# Patient Record
Sex: Male | Born: 1995 | Race: White | Hispanic: No | Marital: Single | Smoking: Former smoker
Health system: Southern US, Community
[De-identification: ages and names within clinical notes are randomized; demographics above are authoritative.]

## PROBLEM LIST (undated history)

## (undated) HISTORY — PX: WISDOM TOOTH EXTRACTION: SHX21

---
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
## 2014-01-26 ENCOUNTER — Emergency Department (HOSPITAL_COMMUNITY)
Admission: EM | Admit: 2014-01-26 | Discharge: 2014-01-26 | Disposition: A | Discharge status: Home / Self Care | Payer: BC Managed Care – PPO | Attending: Emergency Medicine | Admitting: Emergency Medicine

## 2014-01-26 ENCOUNTER — Emergency Department (HOSPITAL_COMMUNITY): Payer: BC Managed Care – PPO

## 2014-01-26 ENCOUNTER — Encounter (HOSPITAL_COMMUNITY): Payer: Self-pay | Admitting: Emergency Medicine

## 2014-01-26 DIAGNOSIS — Y9389 Activity, other specified: Secondary | ICD-10-CM | POA: Diagnosis not present

## 2014-01-26 DIAGNOSIS — S42001A Fracture of unspecified part of right clavicle, initial encounter for closed fracture: Secondary | ICD-10-CM

## 2014-01-26 DIAGNOSIS — S46909A Unspecified injury of unspecified muscle, fascia and tendon at shoulder and upper arm level, unspecified arm, initial encounter: Secondary | ICD-10-CM | POA: Insufficient documentation

## 2014-01-26 DIAGNOSIS — S42023A Displaced fracture of shaft of unspecified clavicle, initial encounter for closed fracture: Secondary | ICD-10-CM | POA: Insufficient documentation

## 2014-01-26 DIAGNOSIS — Y9289 Other specified places as the place of occurrence of the external cause: Secondary | ICD-10-CM | POA: Insufficient documentation

## 2014-01-26 DIAGNOSIS — S4980XA Other specified injuries of shoulder and upper arm, unspecified arm, initial encounter: Secondary | ICD-10-CM | POA: Insufficient documentation

## 2014-01-26 DIAGNOSIS — W010XXA Fall on same level from slipping, tripping and stumbling without subsequent striking against object, initial encounter: Secondary | ICD-10-CM | POA: Insufficient documentation

## 2014-01-26 DIAGNOSIS — Z87891 Personal history of nicotine dependence: Secondary | ICD-10-CM | POA: Insufficient documentation

## 2014-01-26 MED ORDER — OXYCODONE-ACETAMINOPHEN 5-325 MG PO TABS: 1.0000 | ORAL_TABLET | Freq: Four times a day (QID) | ORAL | Status: DC | PRN

## 2014-01-26 MED ORDER — OXYCODONE-ACETAMINOPHEN 5-325 MG PO TABS
1.0000 | ORAL_TABLET | Freq: Once | ORAL | Status: AC
  Administered 2014-01-26: 1 via ORAL
  Filled 2014-01-26: qty 1

## 2014-01-26 NOTE — Discharge Instructions (Signed)
Clavicle Fracture °The clavicle, also called the collarbone, is the long bone that connects your shoulder to your rib cage. You can feel your collarbone at the top of your shoulders and rib cage. A clavicle fracture is a broken clavicle. It is a common injury that can happen at any age.  °CAUSES °Common causes of a clavicle fracture include: °· A direct blow to your shoulder. °· A car accident. °· A fall, especially if you try to break your fall with an outstretched arm. °RISK FACTORS °You may be at increased risk if: °· You are younger than 25 years or older than 75 years. Most clavicle fractures happen to people who are younger than 25 years. °· You are a male. °· You play contact sports. °SIGNS AND SYMPTOMS °A fractured clavicle is painful. It also makes it hard to move your arm. Other signs and symptoms may include: °· A shoulder that drops downward and forward. °· Pain when trying to lift your shoulder. °· Bruising, swelling, and tenderness over your clavicle. °· A grinding noise when you try to move your shoulder. °· A bump over your clavicle. °DIAGNOSIS °Your health care provider can usually diagnose a clavicle fracture by asking about your injury and examining your shoulder and clavicle. He or she may take an X-ray to determine the position of your clavicle. °TREATMENT °Treatment depends on the position of your clavicle after the fracture: °· If the broken ends of the bone are not out of place, your health care provider may put your arm in a sling or wrap a support bandage around your chest (figure-of-eight wrap). °· If the broken ends of the bone are out of place, you may need surgery. Surgery may involve placing screws, pins, or plates to keep your clavicle stable while it heals. Healing may take about 3 months. °When your health care provider thinks your fracture has healed enough, you may have to do physical therapy to regain normal movement and build up your arm strength. °HOME CARE INSTRUCTIONS   °· Apply ice to the injured area: °¨ Put ice in a plastic bag. °¨ Place a towel between your skin and the bag. °¨ Leave the ice on for 20 minutes, 2-3 times a day. °· If you have a wrap or splint: °¨ Wear it all the time, and remove it only to take a bath or shower. °¨ When you bathe or shower, keep your shoulder in the same position as when the sling or wrap is on. °¨ Do not lift your arm. °· If you have a figure-of-eight wrap: °¨ Another person must tighten it every day. °¨ It should be tight enough to hold your shoulders back. °¨ Allow enough room to place your index finger between your body and the strap. °¨ Loosen the wrap immediately if you feel numbness or tingling in your hands. °· Only take medicines as directed by your health care provider. °· Avoid activities that make the injury or pain worse for 4-6 weeks after surgery. °· Keep all follow-up appointments. °SEEK MEDICAL CARE IF:  °Your medicine is not helping to relieve pain and swelling. °SEEK IMMEDIATE MEDICAL CARE IF:  °Your arm is numb, cold, or pale, even when the splint is loose. °MAKE SURE YOU:  °· Understand these instructions. °· Will watch your condition. °· Will get help right away if you are not doing well or get worse. °Document Released: 02/02/2005 Document Revised: 04/30/2013 Document Reviewed: 03/18/2013 °[employer]® Patient Information ©2015 [employer]. This information is   not intended to replace advice given to you by your health care provider. Make sure you discuss any questions you have with your health care provider. ° °
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
## 2014-08-12 ENCOUNTER — Emergency Department (INDEPENDENT_AMBULATORY_CARE_PROVIDER_SITE_OTHER)
Admission: EM | Admit: 2014-08-12 | Discharge: 2014-08-12 | Disposition: A | Discharge status: Home / Self Care | Payer: BLUE CROSS/BLUE SHIELD | Source: Home / Self Care | Attending: Family Medicine | Admitting: Family Medicine

## 2014-08-12 ENCOUNTER — Emergency Department (INDEPENDENT_AMBULATORY_CARE_PROVIDER_SITE_OTHER): Payer: BLUE CROSS/BLUE SHIELD

## 2014-08-12 ENCOUNTER — Encounter (HOSPITAL_COMMUNITY): Payer: Self-pay | Admitting: Emergency Medicine

## 2014-08-12 DIAGNOSIS — S60221A Contusion of right hand, initial encounter: Secondary | ICD-10-CM

## 2014-08-12 NOTE — Discharge Instructions (Signed)
Wear splint for comfort, soak hand twice a day for stiffness and swelling, advil for soreness.
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
## 2014-08-12 NOTE — ED Provider Notes (Signed)
CSN: [id number]     Arrival date & time 08/12/14  0800 History   First MD Initiated Contact with Patient 08/12/14 0809     Chief Complaint  Patient presents with  . Hand Injury   (Consider location/radiation/quality/duration/timing/severity/associated sxs/prior Treatment) Patient is a 19 y.o. male presenting with hand injury. The history is provided by the patient.  Hand Injury Location:  Hand Time since incident:  1 day Hand location:  Dorsum of R hand Pain details:    Quality:  Throbbing   Radiates to:  Does not radiate   Severity:  Mild Chronicity:  New Dislocation: no   Foreign body present:  No foreign bodies Prior injury to area:  No Relieved by:  Ice Ineffective treatments:  None tried Associated symptoms: decreased range of motion, stiffness and swelling   Associated symptoms: no numbness     History reviewed. No pertinent past medical history. History reviewed. No pertinent past surgical history. History reviewed. No pertinent family history. History  Substance Use Topics  . Smoking status: Former Games developermoker  . Smokeless tobacco: Not on file  . Alcohol Use: Yes    Review of Systems  Constitutional: Negative.   Musculoskeletal: Positive for joint swelling and stiffness.  Skin: Negative.     Allergies  Review of patient's allergies indicates no known allergies.  Home Medications   Prior to Admission medications   Medication Sig Start Date End Date Taking? Authorizing Provider  amoxicillin (AMOXIL) 500 MG tablet Take 500 mg by mouth 2 (two) times daily.   Yes Historical Provider, MD  ibuprofen (ADVIL,MOTRIN) 200 MG tablet Take 400 mg by mouth every 6 (six) hours as needed (for pain).    Historical Provider, MD  oxyCODONE-acetaminophen (PERCOCET/ROXICET) 5-325 MG per tablet Take 1-2 tablets by mouth every 6 (six) hours as needed for severe pain. 01/26/14   Benjiman CoreNathan Pickering, MD   BP 124/66 mmHg  Pulse 66  Temp(Src) 98.1 F (36.7 C) (Oral)  Resp 16  SpO2  100% Physical Exam  Constitutional: He is oriented to person, place, and time. He appears well-developed and well-nourished. No distress.  Musculoskeletal: He exhibits tenderness.       Hands: Neurological: He is alert and oriented to person, place, and time.  Skin: Skin is warm and dry.  Nursing note and vitals reviewed.   ED Course  Procedures (including critical care time) Labs Review Labs Reviewed - No data to display  Imaging Review Dg Hand Complete Right  08/12/2014   CLINICAL DATA:  Right hand swelling and bruising, soccer injury last night  EXAM: RIGHT HAND - COMPLETE 3+ VIEW  COMPARISON:  None.  FINDINGS: Three views of the right hand submitted. No acute fracture or subluxation. Soft tissue swelling noted dorsal metacarpal region.  IMPRESSION: No acute fracture or subluxation. Soft tissue swelling metacarpal region.   Electronically Signed   By: Natasha MeadLiviu  Pop M.D.   On: 08/12/2014 08:33     MDM   1. Contusion, hand, right, initial encounter        Linna HoffJames D Tayvon Culley, MD 08/12/14 [phone]

## 2015-03-05 ENCOUNTER — Encounter (HOSPITAL_COMMUNITY): Payer: Self-pay | Admitting: Emergency Medicine

## 2015-03-05 ENCOUNTER — Emergency Department (INDEPENDENT_AMBULATORY_CARE_PROVIDER_SITE_OTHER)
Admission: EM | Admit: 2015-03-05 | Discharge: 2015-03-05 | Disposition: A | Discharge status: Home / Self Care | Payer: BLUE CROSS/BLUE SHIELD | Source: Home / Self Care | Attending: Emergency Medicine | Admitting: Emergency Medicine

## 2015-03-05 ENCOUNTER — Emergency Department (INDEPENDENT_AMBULATORY_CARE_PROVIDER_SITE_OTHER): Payer: BLUE CROSS/BLUE SHIELD

## 2015-03-05 DIAGNOSIS — S62625A Displaced fracture of medial phalanx of left ring finger, initial encounter for closed fracture: Secondary | ICD-10-CM

## 2015-03-05 NOTE — Discharge Instructions (Signed)
Finger is broken. Keep the splint on until you see Dr. Izora Ribasoley. Apply ice to help with the swelling. Call Dr. Izora Ribasoley tomorrow morning for an appointment next week.

## 2015-03-05 NOTE — ED Notes (Signed)
C/o left ring finger injury due to playing flag football two weeks ago Not able to bend finger  States did ice finger as tx

## 2015-03-05 NOTE — ED Provider Notes (Signed)
CSN: [id number]     Arrival date & time 03/05/15  1810 History   First MD Initiated Contact with Patient 03/05/15 1839     Chief Complaint  Patient presents with  . Finger Injury   (Consider location/radiation/quality/duration/timing/severity/associated sxs/prior Treatment) HPI  He is a 19 year old man here for evaluation of left ring finger injury. He states he is playing flag football 2 weeks ago when he injured his left ring finger. He states it was deviated towards the middle finger. He states he freaked out and grabbed his finger. It popped back into place and he developed some swelling at the PIP joint. He has been applying ice, but the swelling has not gone down. He states he is unable to fully flex the finger. No numbness or tingling.  History reviewed. No pertinent past medical history. History reviewed. No pertinent past surgical history. History reviewed. No pertinent family history. Social History  Substance Use Topics  . Smoking status: Former Games developermoker  . Smokeless tobacco: None  . Alcohol Use: Yes    Review of Systems As in history of present illness Allergies  Review of patient's allergies indicates no known allergies.  Home Medications   Prior to Admission medications   Medication Sig Start Date End Date Taking? Authorizing Provider  ibuprofen (ADVIL,MOTRIN) 200 MG tablet Take 400 mg by mouth every 6 (six) hours as needed (for pain).    Historical Provider, MD   Meds Ordered and Administered this Visit  Medications - No data to display  BP 124/82 mmHg  Pulse 56  Temp(Src) 98.9 F (37.2 C) (Oral)  Resp 16  SpO2 100% No data found.   Physical Exam  Constitutional: He is oriented to person, place, and time. He appears well-developed and well-nourished. No distress.  Cardiovascular: Normal rate.   Pulmonary/Chest: Effort normal.  Musculoskeletal:  Left ring finger: There is swelling at the PIP joint. No point tenderness. Brisk cap refill. He is able to flex  both the PIP and DIP joint. He is unable to completely make a fist with that finger. He is able to fully extend the finger.  Neurological: He is alert and oriented to person, place, and time.    ED Course  Procedures (including critical care time)  Labs Review Labs Reviewed - No data to display  Imaging Review Dg Finger Ring Left  03/05/2015  CLINICAL DATA:  Mild pain and limited range of motion after dislocating the left ring finger playing flag football 2 weeks ago. EXAM: LEFT RING FINGER 2+V COMPARISON:  None. FINDINGS: Soft tissue swelling at the level of the fourth PIP joint. Mildly comminuted fracture of the base of the fourth proximal phalanx with a medially displaced bone fragment. There is also a ventrally displaced fragment. IMPRESSION: Mildly comminuted fracture of the base of the fourth proximal phalanx with associated soft tissue swelling. Electronically Signed   By: Beckie SaltsSteven  Reid M.D.   On: 03/05/2015 19:12      MDM   1. Closed displaced fracture of medial phalanx of left ring finger, initial encounter    Placed in splint. Apply ice to help with swelling. Follow-up with Dr. Izora Ribasoley next week.    Charm RingsErin J Honig, MD 03/05/15 [phone]

## 2015-07-17 ENCOUNTER — Emergency Department (INDEPENDENT_AMBULATORY_CARE_PROVIDER_SITE_OTHER): Payer: 59

## 2015-07-17 ENCOUNTER — Emergency Department (INDEPENDENT_AMBULATORY_CARE_PROVIDER_SITE_OTHER)
Admission: EM | Admit: 2015-07-17 | Discharge: 2015-07-17 | Disposition: A | Discharge status: Home / Self Care | Payer: 59 | Source: Home / Self Care | Attending: Family Medicine | Admitting: Family Medicine

## 2015-07-17 DIAGNOSIS — R0789 Other chest pain: Secondary | ICD-10-CM

## 2015-07-17 DIAGNOSIS — R079 Chest pain, unspecified: Secondary | ICD-10-CM | POA: Diagnosis not present

## 2015-07-17 DIAGNOSIS — R9431 Abnormal electrocardiogram [ECG] [EKG]: Secondary | ICD-10-CM

## 2015-07-17 DIAGNOSIS — R002 Palpitations: Secondary | ICD-10-CM

## 2015-07-17 DIAGNOSIS — R55 Syncope and collapse: Secondary | ICD-10-CM | POA: Diagnosis not present

## 2015-07-17 LAB — POCT CBC W AUTO DIFF (K'VILLE URGENT CARE)

## 2015-07-17 NOTE — ED Notes (Signed)
Orthostatic BP lying HR-79 BP 156/92 O2 99 Sitting HR 79 BP 149/100 O2 97 Standing HR 96 BP 168/113 O2 99

## 2015-07-17 NOTE — ED Notes (Signed)
Around 2 pm today had pressure in his chest, and a few seconds of SOB.  He felt dizzy after, and like his heart was skipping a beat.

## 2015-07-17 NOTE — ED Provider Notes (Signed)
CSN: [id number]     Arrival date & time 07/17/15  1932 History   First MD Initiated Contact with Patient 07/17/15 1941     Chief Complaint  Patient presents with  . Chest Pain   (Consider location/radiation/quality/duration/timing/severity/associated sxs/prior Treatment) HPI  The pt is a 20yo male [facility] by his father with c/o intermittent episodes of heart palpitations and chest pressure that lasts about 1-2 minutes at a time.  They care start when pt is at rest and resolve on their own.  Symptoms started 2-3 weeks ago but today one episode around 2PM was the worst he has had since symptoms started. He was speaking with a customer when he suddenly became SOB and had to walk away to sit down as he was becoming dizzy.  Symptoms resolved within 1 minute. Denies LOC. Denies nausea, vomiting or diaphoresis.  Chest pain is described as a mild pressure in the center of his chest. Denies having more stress in his life than normal. Denies any significant life changes including no decreased sleep or excess caffeine intake. Denies family hx of heart problems.  He has not had an annual physical in several years as he is otherwise healthy. No daily medications. No recent travel. Denies leg pain or swelling.    History reviewed. No pertinent past medical history. Past Surgical History  Procedure Laterality Date  . Wisdom tooth extraction     History reviewed. No pertinent family history. Social History  Substance Use Topics  . Smoking status: Former Games developermoker  . Smokeless tobacco: None  . Alcohol Use: Yes    Review of Systems  Constitutional: Negative for fever, chills, diaphoresis and fatigue.  HENT: Negative for congestion, sore throat, trouble swallowing and voice change.   Respiratory: Positive for shortness of breath. Negative for cough.   Cardiovascular: Positive for chest pain and palpitations. Negative for leg swelling.  Gastrointestinal: Negative for nausea, vomiting, abdominal pain and  diarrhea.  Musculoskeletal: Negative for myalgias, back pain and arthralgias.  Neurological: Positive for dizziness and light-headedness. Negative for syncope, weakness and headaches.    Allergies  Review of patient's allergies indicates no known allergies.  Home Medications   Prior to Admission medications   Medication Sig Start Date End Date Taking? Authorizing Provider  ibuprofen (ADVIL,MOTRIN) 200 MG tablet Take 400 mg by mouth every 6 (six) hours as needed (for pain).    Historical Provider, MD   Meds Ordered and Administered this Visit  Medications - No data to display  BP 154/96 mmHg  Pulse 86  Temp(Src) 98.2 F (36.8 C) (Oral)  Ht 5\' 11"  (1.803 m)  SpO2 98% No data found.   Physical Exam  Constitutional: He appears well-developed and well-nourished.  Thin male lying on exam bed, NAD.   HENT:  Head: Normocephalic and atraumatic.  Eyes: Conjunctivae are normal. No scleral icterus.  Neck: Normal range of motion. Neck supple.  Cardiovascular: Normal rate, regular rhythm and normal heart sounds.   Regular rate and rhythm  Pulmonary/Chest: Effort normal and breath sounds normal. No respiratory distress. He has no wheezes. He has no rales. He exhibits no tenderness.  Abdominal: Soft. He exhibits no distension. There is no tenderness.  Musculoskeletal: Normal range of motion.  Neurological: He is alert.  Skin: Skin is warm and dry.  Psychiatric: He has a normal mood and affect. His behavior is normal.  Nursing note and vitals reviewed.   ED Course  Procedures (including critical care time)  Labs Review Labs Reviewed  BASIC METABOLIC PANEL  POCT CBC W AUTO DIFF ([facility])    Imaging Review Dg Chest 2 View  07/17/2015  CLINICAL DATA:  Left-sided chest pain and palpitations. Tingling going down left arm. Symptoms for 1 month, progressive today. EXAM: CHEST  2 VIEW COMPARISON:  None. FINDINGS: The cardiomediastinal contours are normal. The lungs are  clear. Pulmonary vasculature is normal. No consolidation, pleural effusion, or pneumothorax. No acute osseous abnormalities are seen. IMPRESSION: Normal chest radiograph. Electronically Signed   By: Rubye Oaks M.D.   On: 07/17/2015 20:07    Date/Time:07/17/15   19:49:38  Ventricular Rate: 89 PR Interval: 116 QRS Duration: 98 QT Interval: 372 QTC Calculation: 422 Text Interpretation: Sinus rhythm, short PR syndrome. RSR(V1)- probably normal for age. Voltage criteria for LVH (R(V5) exceeds 2.60 mV) -Voltage criteria w/o ST/T abnormality may be normal.   No prior EKG to compare.     MDM   1. Chest pressure   2. Palpitations   3. Shortened PR interval   4. Near syncope     Pt c/o chest pressure with palpitations and mild dizziness but no syncope. No prior hx of heart problems or family hx of heart problems.    EKG- significant for short PR interval and possible LVH.  Encouraged close f/u with PCP next week.  Cardiology referral placed. Encouraged to call if he does not get a call from cardiology by next week.  Advised cardiology appointment however may not be fore another few weeks.    Advised not to drive until f/u with PCP due to risk of passing out while driving. Encouraged to limit caffeine consumption and no strenuous physical activity until f/u with PCP or cardiology.  Discussed symptoms that warrant emergent care in the ED. Patient and father verbalized understanding and agreement with treatment plan.   Junius Finner, PA-C 07/17/15 2034

## 2015-07-17 NOTE — Discharge Instructions (Signed)
Please try not to drive until reevaluated.  Limit caffeine intake.  Be sure to follow up with primary care and cardiology for further evaluation of symptoms.    Please call 911 or go to closest emergency department if symptoms worsen over the weekend.

## 2015-07-18 LAB — BASIC METABOLIC PANEL
BUN: 11 mg/dL (ref 7–25)
CO2: 28 mmol/L (ref 20–31)
Calcium: 10 mg/dL (ref 8.6–10.3)
Chloride: 99 mmol/L (ref 98–110)
Creat: 0.88 mg/dL (ref 0.60–1.35)
Glucose, Bld: 94 mg/dL (ref 65–99)
Potassium: 3.9 mmol/L (ref 3.5–5.3)
Sodium: 137 mmol/L (ref 135–146)

## 2015-07-19 ENCOUNTER — Telehealth: Payer: Self-pay | Admitting: Emergency Medicine

## 2015-08-26 ENCOUNTER — Ambulatory Visit (INDEPENDENT_AMBULATORY_CARE_PROVIDER_SITE_OTHER): Payer: 59 | Admitting: Cardiovascular Disease

## 2015-08-26 ENCOUNTER — Encounter: Payer: Self-pay | Admitting: Cardiovascular Disease

## 2015-08-26 VITALS — BP 126/90 | HR 69 | Ht 71.0 in | Wt 150.8 lb

## 2015-08-26 DIAGNOSIS — R002 Palpitations: Secondary | ICD-10-CM

## 2015-08-26 DIAGNOSIS — I471 Supraventricular tachycardia, unspecified: Secondary | ICD-10-CM | POA: Insufficient documentation

## 2015-08-26 NOTE — Patient Instructions (Addendum)
Medication Instructions:  Your physician does not recommend that you start any new medications   Labwork: None Ordered   Testing/Procedures: Your physician has requested that you have an echocardiogram. Echocardiography is a painless test that uses sound waves to create images of your heart. It provides your doctor with information about the size and shape of your heart and how well your heart's chambers and valves are working. This procedure takes approximately one hour. There are no restrictions for this procedure.   Your physician has recommended that you wear an event monitor. Event monitors are medical devices that record the heart's electrical activity. Doctors most often us these monitors to diagnose arrhythmias. Arrhythmias are problems with the speed or rhythm of the heartbeat. The monitor is a small, portable device. You can wear one while you do your normal daily activities. This is usually used to diagnose what is causing palpitations/syncope (passing out).   Follow-Up: Your physician recommends that you schedule a follow-up appointment in: 6 weeks with Dr. Elease HashimotoNahser   If you need a refill on your cardiac medications before your next appointment, please call your pharmacy.   Thank you for choosing [facility]! Eligha BridegroomMichelle Alethia Melendrez, RN [phone]

## 2015-08-26 NOTE — Progress Notes (Signed)
Cardiology Office Note   Date:  08/26/2015   ID:  Frederick Austin, DOB Jan 25, 1996, MRN [id number]  PCP:  PROVIDER NOT IN SYSTEM  Cardiologist:   Trevia Nop, Deloris PingPhilip J, MD   Chief Complaint  Patient presents with  . Tachycardia   Problem List 1.  tachypalpitations   History of Present Illness: Frederick Austin is a 20 y.o. male who presents for tachycardia  For the past 3-4 weeks,  Was talking to a customer ( works at [employer]) , felt very light headed, felt his heart racing . Initial episode last 5 minutes. Splashed cold water on his face, took several deep breaths,  Felt better but still not quite right. Sent to urgent care in [place],  Has occurred several times since that time.    Does not last as long   He has noticed that these occur predominately on days following a night of drinking .     The long episode was associated with chest pressure Has had some problems sleeping  - feels that his heart is pounding . Has sudden palpitations that cause him to "need to catch his breath"  These last for only a second - likely are PVC  Has cut out caffeine  - used to drink several coffees.    Has stopped drinking sodas Occasional addarall ( a friends )   Has started running in the mornings -  Since that time, had no problems  Played sports in high school.  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    No past medical history on file.  Past Surgical History  Procedure Laterality Date  . Wisdom tooth extraction       Current Outpatient Prescriptions  Medication Sig Dispense Refill  . ibuprofen (ADVIL,MOTRIN) 200 MG tablet Take 400 mg by mouth every 6 (six) hours as needed (for pain).     No current facility-administered medications for this visit.    Allergies:   Review of patient's allergies indicates no known allergies.    Social History:  The patient  reports that he has quit smoking. He does not have any smokeless  tobacco history on file. He reports that he drinks alcohol. He reports that he does not use illicit drugs.   Family History:  The patient's family history includes Heart murmur in his mother.    ROS:  Please see the history of present illness.    Review of Systems: Constitutional:  denies fever, chills, diaphoresis, appetite change and fatigue.  HEENT: denies photophobia, eye pain, redness, hearing loss, ear pain, congestion, sore throat, rhinorrhea, sneezing, neck pain, neck stiffness and tinnitus.  Respiratory: denies SOB, DOE, cough, chest tightness, and wheezing.  Cardiovascular: admits to   Palpitations .  Gastrointestinal: denies nausea, vomiting, abdominal pain, diarrhea, constipation, blood in stool.  Genitourinary: denies dysuria, urgency, frequency, hematuria, flank pain and difficulty urinating.  Musculoskeletal: denies  myalgias, back pain, joint swelling, arthralgias and gait problem.   Skin: denies pallor, rash and wound.  Neurological: denies dizziness, seizures, syncope, weakness, light-headedness, numbness and headaches.   Hematological: denies adenopathy, easy bruising, personal or family bleeding history.  Psychiatric/ Behavioral: denies suicidal ideation, mood changes, confusion, nervousness, sleep disturbance and agitation.       All other systems are reviewed and negative.    PHYSICAL EXAM: VS:  BP 126/90 mmHg  Pulse 69  Ht 5\' 11"  (1.803 m)  Wt 150 lb  12.8 oz (68.402 kg)  BMI 21.04 kg/m2 , BMI Body mass index is 21.04 kg/(m^2). GEN: Well nourished, well developed, in no acute distress HEENT: normal Neck: no JVD, carotid bruits, or masses Cardiac: RRR; no murmurs, rubs, or gallops,no edema  Respiratory:  clear to auscultation bilaterally, normal work of breathing GI: soft, nontender, nondistended, + BS MS: no deformity or atrophy Skin: warm and dry, no rash Neuro:  Strength and sensation are intact Psych: normal   EKG:  EKG is ordered today. The ekg  ordered today demonstrates  NSR with sinus arrhythmia   Recent Labs: 07/17/2015: BUN 11; Creat 0.88; Potassium 3.9; Sodium 137    Lipid Panel No results found for: CHOL, TRIG, HDL, CHOLHDL, VLDL, LDLCALC, LDLDIRECT    Wt Readings from Last 3 Encounters:  08/26/15 150 lb 12.8 oz (68.402 kg)  01/26/14 150 lb (68.04 kg)  02/25/11 128 lb (58.06 kg) (19 %*, Z = -0.88)   * Growth percentiles are based on CDC 2-20 Years data.      Other studies Reviewed: Additional studies/ records that were reviewed today include: . Review of the above records demonstrates:    ASSESSMENT AND PLAN:  1.  Palpitations:   Likely is SVT.   Seems to occur after a night of drinking .  ( This fact took a long time to tease out)  We discussed valsalva , diving reflex. Long discussion about rehydration , V-8 juice,   Also has some PVCs  Discussed the association with drinking   No evidence of WPW or VT Will get an echo to look at his LV function Will also get a 30 day monitor.  Will see him in 6 weeks. No TSH was drawn. We can draw later if he continues to have issues.   Current medicines are reviewed at length with the patient today.  The patient does not have concerns regarding medicines.  The following changes have been made:  no change  Labs/ tests ordered today include:  No orders of the defined types were placed in this encounter.     Disposition:   FU with me in 4-6 weeks      Valrie Jia, Deloris Ping, MD  08/26/2015 9:08 AM    [facility] [address] [place], [place], [place]  [address] Phone: [phone]; Fax: [phone]   [facility]  [address] [place], [place]  [address] [phone]   Fax [phone]

## 2015-09-03 ENCOUNTER — Ambulatory Visit (INDEPENDENT_AMBULATORY_CARE_PROVIDER_SITE_OTHER): Payer: 59

## 2015-09-03 DIAGNOSIS — R002 Palpitations: Secondary | ICD-10-CM | POA: Diagnosis not present

## 2015-09-08 ENCOUNTER — Ambulatory Visit (HOSPITAL_COMMUNITY): Payer: 59 | Attending: Internal Medicine

## 2015-09-08 ENCOUNTER — Other Ambulatory Visit: Payer: Self-pay

## 2015-09-08 DIAGNOSIS — Z87891 Personal history of nicotine dependence: Secondary | ICD-10-CM | POA: Insufficient documentation

## 2015-09-08 DIAGNOSIS — R002 Palpitations: Secondary | ICD-10-CM | POA: Diagnosis not present

## 2015-09-08 DIAGNOSIS — I471 Supraventricular tachycardia: Secondary | ICD-10-CM | POA: Insufficient documentation

## 2015-09-09 ENCOUNTER — Telehealth: Payer: Self-pay | Admitting: Cardiovascular Disease

## 2015-09-09 NOTE — Telephone Encounter (Signed)
Reviewed results of echo and plan for follow-up with patient who verbalized understanding and agreement.

## 2015-09-09 NOTE — Telephone Encounter (Signed)
F/u  Pt returning RN phone call- echo results. Please call back and discuss.   

## 2015-10-06 ENCOUNTER — Encounter: Payer: Self-pay | Admitting: Cardiovascular Disease

## 2015-10-09 ENCOUNTER — Ambulatory Visit: Payer: 59 | Admitting: Cardiovascular Disease

## 2015-10-14 ENCOUNTER — Encounter: Payer: Self-pay | Admitting: Cardiovascular Disease

## 2015-10-14 ENCOUNTER — Ambulatory Visit (INDEPENDENT_AMBULATORY_CARE_PROVIDER_SITE_OTHER): Payer: 59 | Admitting: Cardiovascular Disease

## 2015-10-14 VITALS — BP 130/68 | HR 108 | Ht 71.0 in | Wt 151.0 lb

## 2015-10-14 DIAGNOSIS — I471 Supraventricular tachycardia: Secondary | ICD-10-CM

## 2015-10-14 LAB — TSH: TSH: 0.68 m[IU]/L (ref 0.40–4.50)

## 2015-10-14 MED ORDER — METOPROLOL TARTRATE 25 MG PO TABS: 25.0000 mg | ORAL_TABLET | Freq: Two times a day (BID) | ORAL | Status: AC

## 2015-10-14 NOTE — Progress Notes (Signed)
Cardiology Office Note   Date:  10/14/2015   ID:  Frederick Austin, DOB Feb 27, 1996, MRN [id number]  PCP:  PROVIDER NOT IN SYSTEM  Cardiologist:   Kristeen Miss, MD   Chief Complaint  Patient presents with  . Supraventricular Tachycardia    No chest pain,shortness of breath,lee,or claudication. Complains of chest "pressure"   Problem List 1.  tachypalpitations   History of Present Illness: Frederick Austin is a 20 y.o. male who presents for tachycardia  For the past 3-4 weeks,  Was talking to a customer ( works at [employer]) , felt very light headed, felt his heart racing . Initial episode last 5 minutes. Splashed cold water on his face, took several deep breaths,  Felt better but still not quite right. Sent to urgent care in [place],  Has occurred several times since that time.    Does not last as long   He has noticed that these occur predominately on days following a night of drinking .     The long episode was associated with chest pressure Has had some problems sleeping  - feels that his heart is pounding . Has sudden palpitations that cause him to "need to catch his breath"  These last for only a second - likely are PVC  Has cut out caffeine  - used to drink several coffees.    Has stopped drinking sodas Occasional addarall ( a friends )   Has started running in the mornings -  Since that time, had no problems  Played sports in high school.  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   October 14, 2015: Frederick Austin is seen today for follow up of SVT. 30 day monitor revealed several episodes of SVT  He was asymptomatic. Felt occasional fast HR  Had several episods of HR of 190    No past medical history on file.  Past Surgical History  Procedure Laterality Date  . Wisdom tooth extraction       Current Outpatient Prescriptions  Medication Sig Dispense Refill  . ibuprofen (ADVIL,MOTRIN) 200 MG tablet Take 400 mg by  mouth every 6 (six) hours as needed (for pain).     No current facility-administered medications for this visit.    Allergies:   Review of patient's allergies indicates no known allergies.    Social History:  The patient  reports that he has quit smoking. He does not have any smokeless tobacco history on file. He reports that he drinks alcohol. He reports that he does not use illicit drugs.   Family History:  The patient's family history includes Heart murmur in his mother.    ROS:  Please see the history of present illness.    Review of Systems: Constitutional:  denies fever, chills, diaphoresis, appetite change and fatigue.  HEENT: denies photophobia, eye pain, redness, hearing loss, ear pain, congestion, sore throat, rhinorrhea, sneezing, neck pain, neck stiffness and tinnitus.  Respiratory: denies SOB, DOE, cough, chest tightness, and wheezing.  Cardiovascular: admits to   Palpitations .  Gastrointestinal: denies nausea, vomiting, abdominal pain, diarrhea, constipation, blood in stool.  Genitourinary: denies dysuria, urgency, frequency, hematuria, flank pain and difficulty urinating.  Musculoskeletal: denies  myalgias, back pain, joint swelling, arthralgias and gait problem.   Skin: denies pallor, rash and wound.  Neurological: denies dizziness, seizures, syncope, weakness, light-headedness, numbness and headaches.   Hematological: denies adenopathy, easy bruising, personal or family bleeding  history.  Psychiatric/ Behavioral: denies suicidal ideation, mood changes, confusion, nervousness, sleep disturbance and agitation.       All other systems are reviewed and negative.    PHYSICAL EXAM: VS:  BP 130/68 mmHg  Pulse 108  Ht 5\' 11"  (1.803 m)  Wt 151 lb (68.493 kg)  BMI 21.07 kg/m2 , BMI Body mass index is 21.07 kg/(m^2). GEN: Well nourished, well developed, in no acute distress HEENT: normal Neck: no JVD, carotid bruits, or masses Cardiac: RRR; no murmurs, rubs, or  gallops,no edema  Respiratory:  clear to auscultation bilaterally, normal work of breathing GI: soft, nontender, nondistended, + BS MS: no deformity or atrophy Skin: warm and dry, no rash Neuro:  Strength and sensation are intact Psych: normal   EKG:  EKG is ordered today. The ekg ordered today demonstrates  NSR with sinus arrhythmia   Recent Labs: 07/17/2015: BUN 11; Creat 0.88; Potassium 3.9; Sodium 137    Lipid Panel No results found for: CHOL, TRIG, HDL, CHOLHDL, VLDL, LDLCALC, LDLDIRECT    Wt Readings from Last 3 Encounters:  10/14/15 151 lb (68.493 kg)  08/26/15 150 lb 12.8 oz (68.402 kg)  01/26/14 150 lb (68.04 kg)      Other studies Reviewed: Additional studies/ records that were reviewed today include: . Review of the above records demonstrates:    ASSESSMENT AND PLAN:  1.  Palpitations:   Likely is SVT.   He has had episodes of SVT at res. HR of 190. No evidence of WPW or VT  Will start metoprolol 25 BId We discussed an EP consult with possible RF ablation  Will consider that if he fails medical therapy    Current medicines are reviewed at length with the patient today.  The patient does not have concerns regarding medicines.  The following changes have been made:  no change  Labs/ tests ordered today include:  No orders of the defined types were placed in this encounter.     Disposition:   FU with me in 3 months.       Kristeen MissPhilip Nahser, MD  10/14/2015 8:05 AM    [facility] [address], [place], [place]  [address] Phone: [phone]; Fax: [phone]) [phone]   [facility]  [address] [place], [place]  [address] [phone]   Fax [phone]

## 2015-10-14 NOTE — Patient Instructions (Signed)
Medication Instructions:  START Metoprolol 25 mg twice daily   Labwork: TODAY - TSH   Testing/Procedures: None Ordered   Follow-Up: Your physician recommends that you schedule a follow-up appointment in: 3 months with Dr. Elease HashimotoNahser   If you need a refill on your cardiac medications before your next appointment, please call your pharmacy.   Thank you for choosing [facility]! Eligha BridegroomMichelle Sherida Dobkins, RN [phone]

## 2015-12-24 ENCOUNTER — Encounter: Payer: Self-pay | Admitting: Cardiovascular Disease

## 2016-01-21 ENCOUNTER — Ambulatory Visit: Payer: 59 | Admitting: Cardiovascular Disease

## 2016-01-26 ENCOUNTER — Telehealth: Payer: Self-pay | Admitting: Cardiovascular Disease

## 2016-01-26 NOTE — Telephone Encounter (Signed)
°  New Prob   Pt now permanently lives in [place] and is requesting recommendation and referral for a cardiologist close to where he lives. Please call.

## 2016-01-26 NOTE — Telephone Encounter (Signed)
Left message that Dr. Elease HashimotoNahser recommends [facility] [place], no specific physician.  I advised he call back with questions or concerns.

## 2016-02-19 IMAGING — DX DG HAND COMPLETE 3+V*R*
3 series · 3 of 3 positions shown · non-contrast
Comparison: None.

CLINICAL DATA: Right hand swelling and bruising, soccer injury last
night

EXAM:
RIGHT HAND - COMPLETE 3+ VIEW

[hand pa]
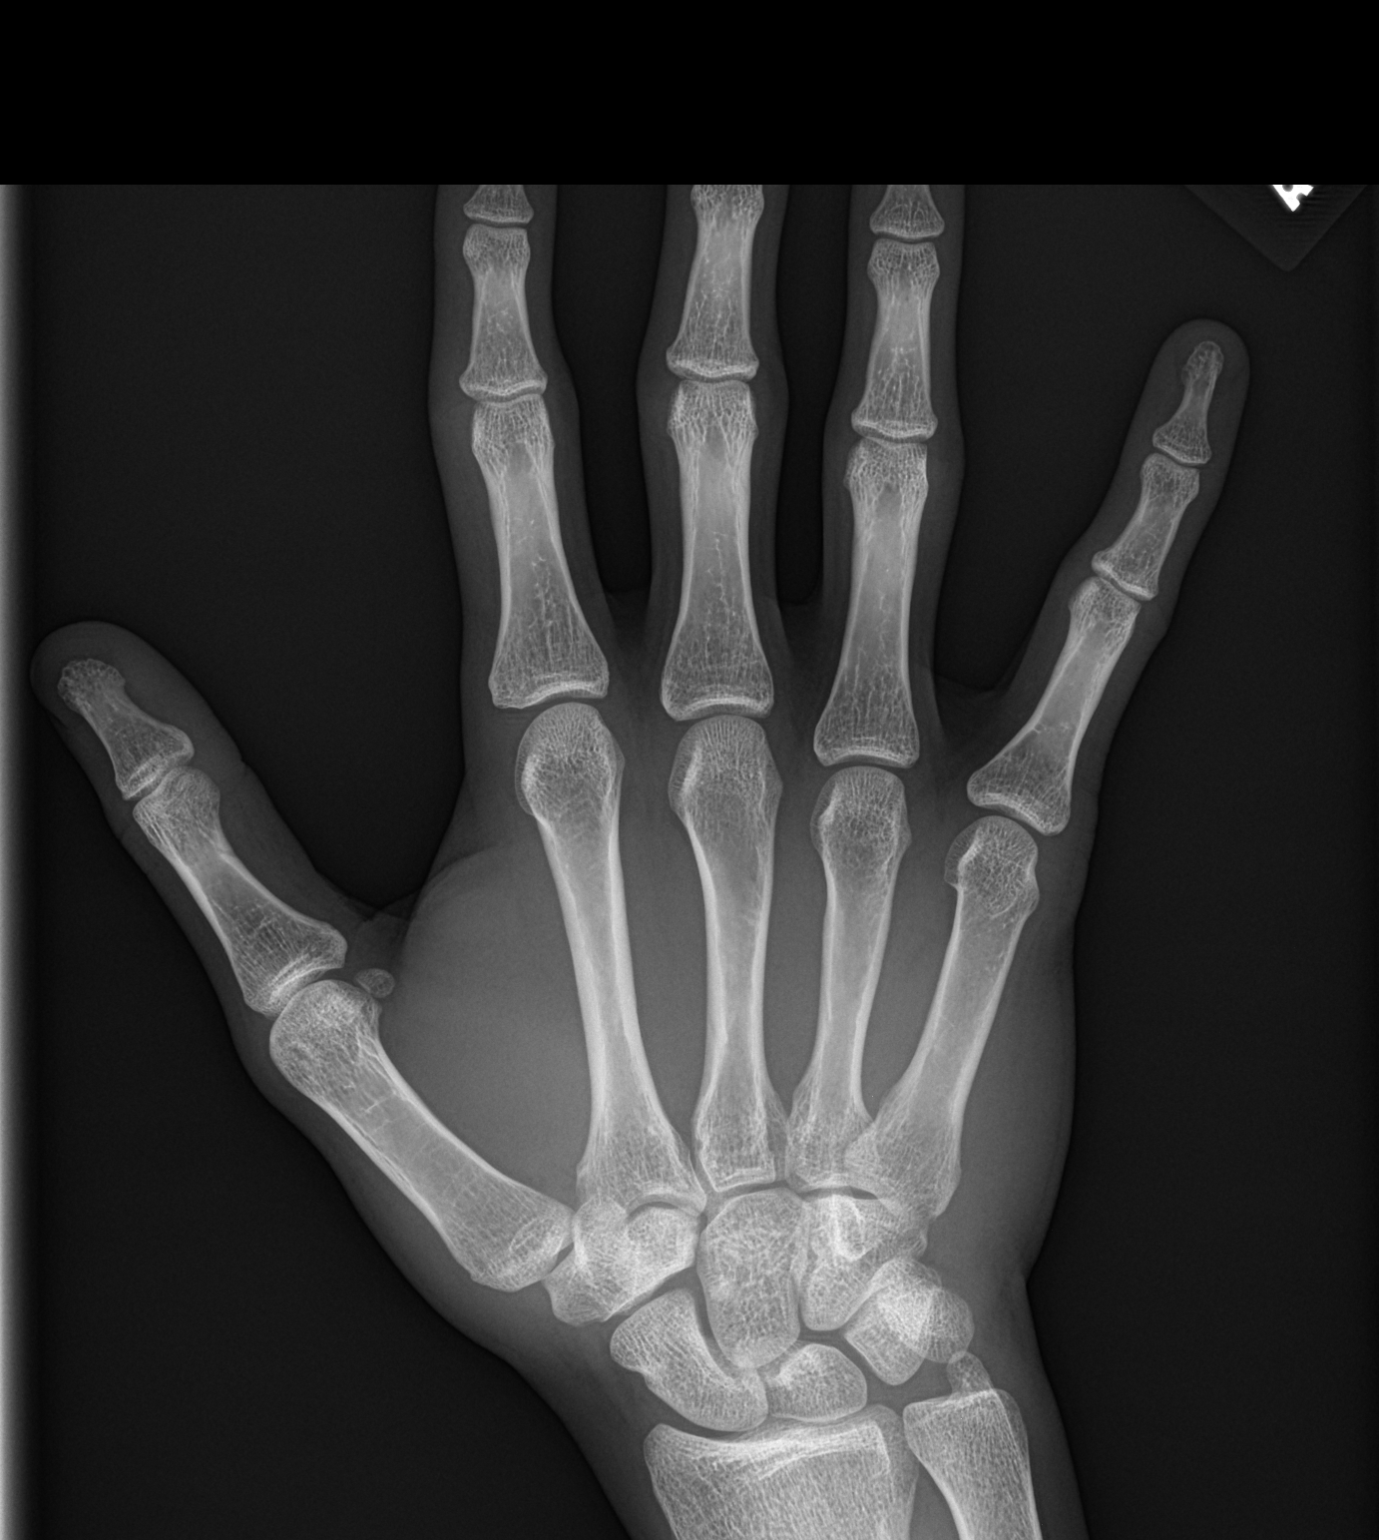

[hand obl]
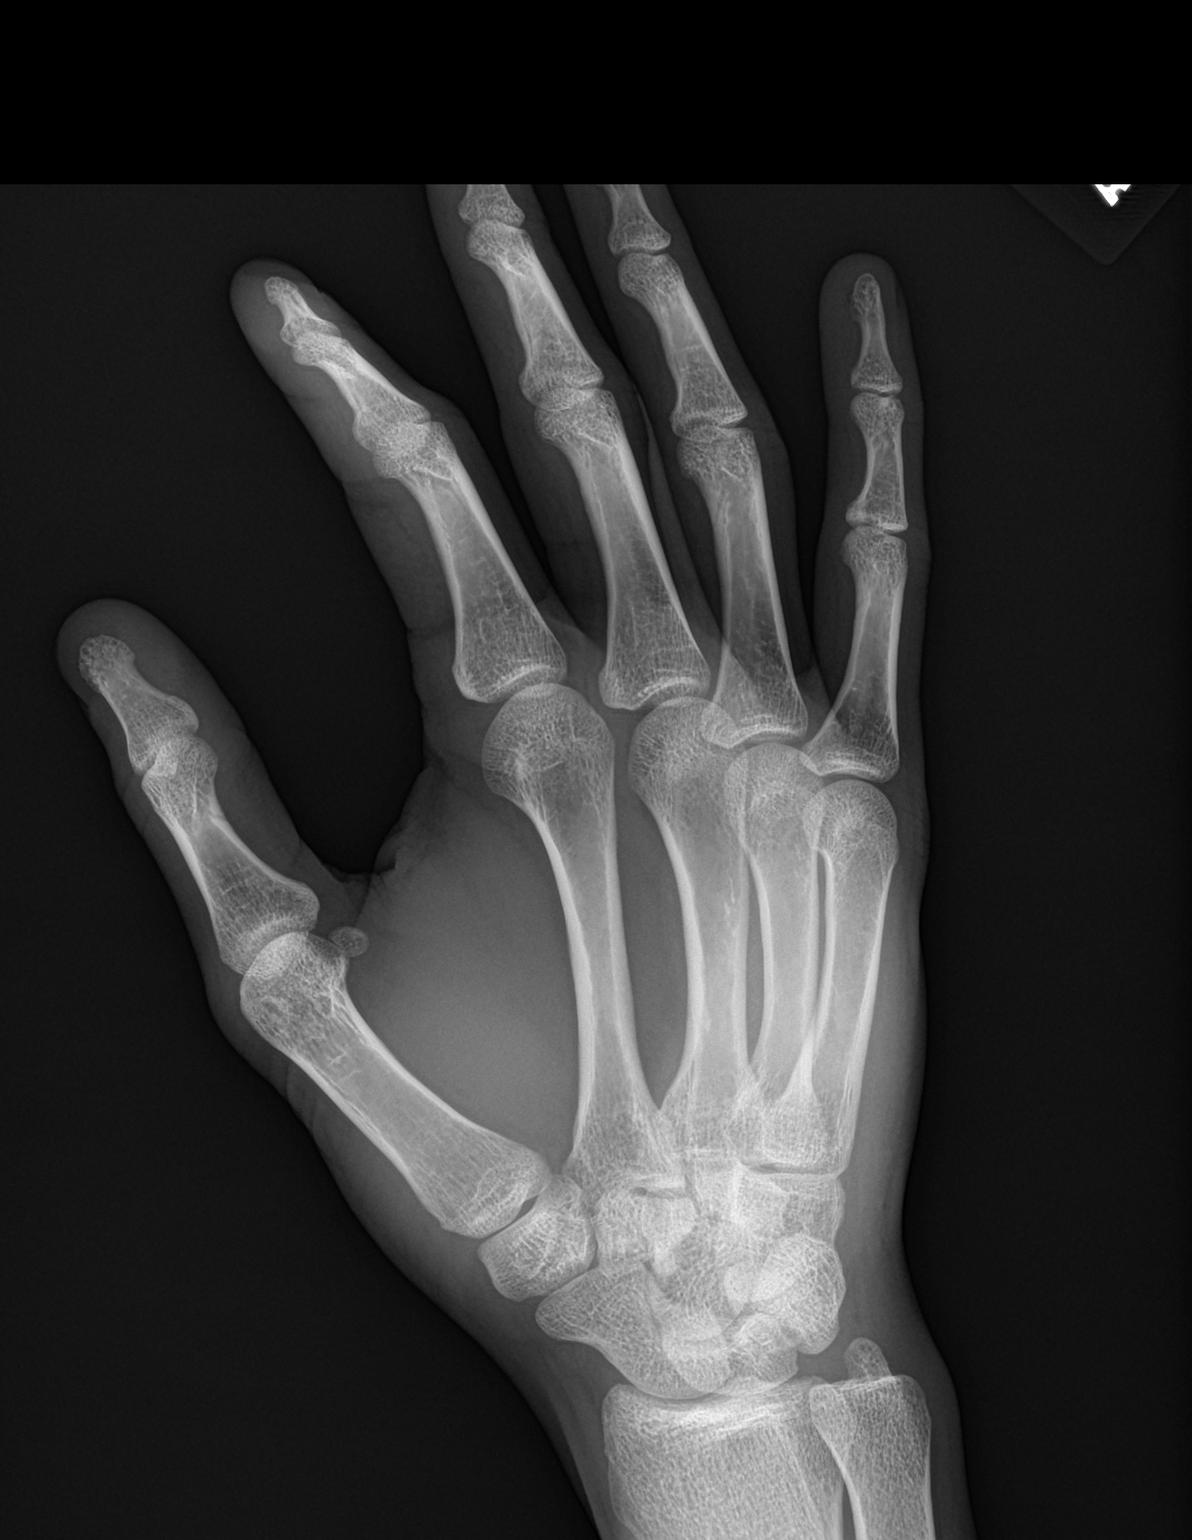

[hand lat]
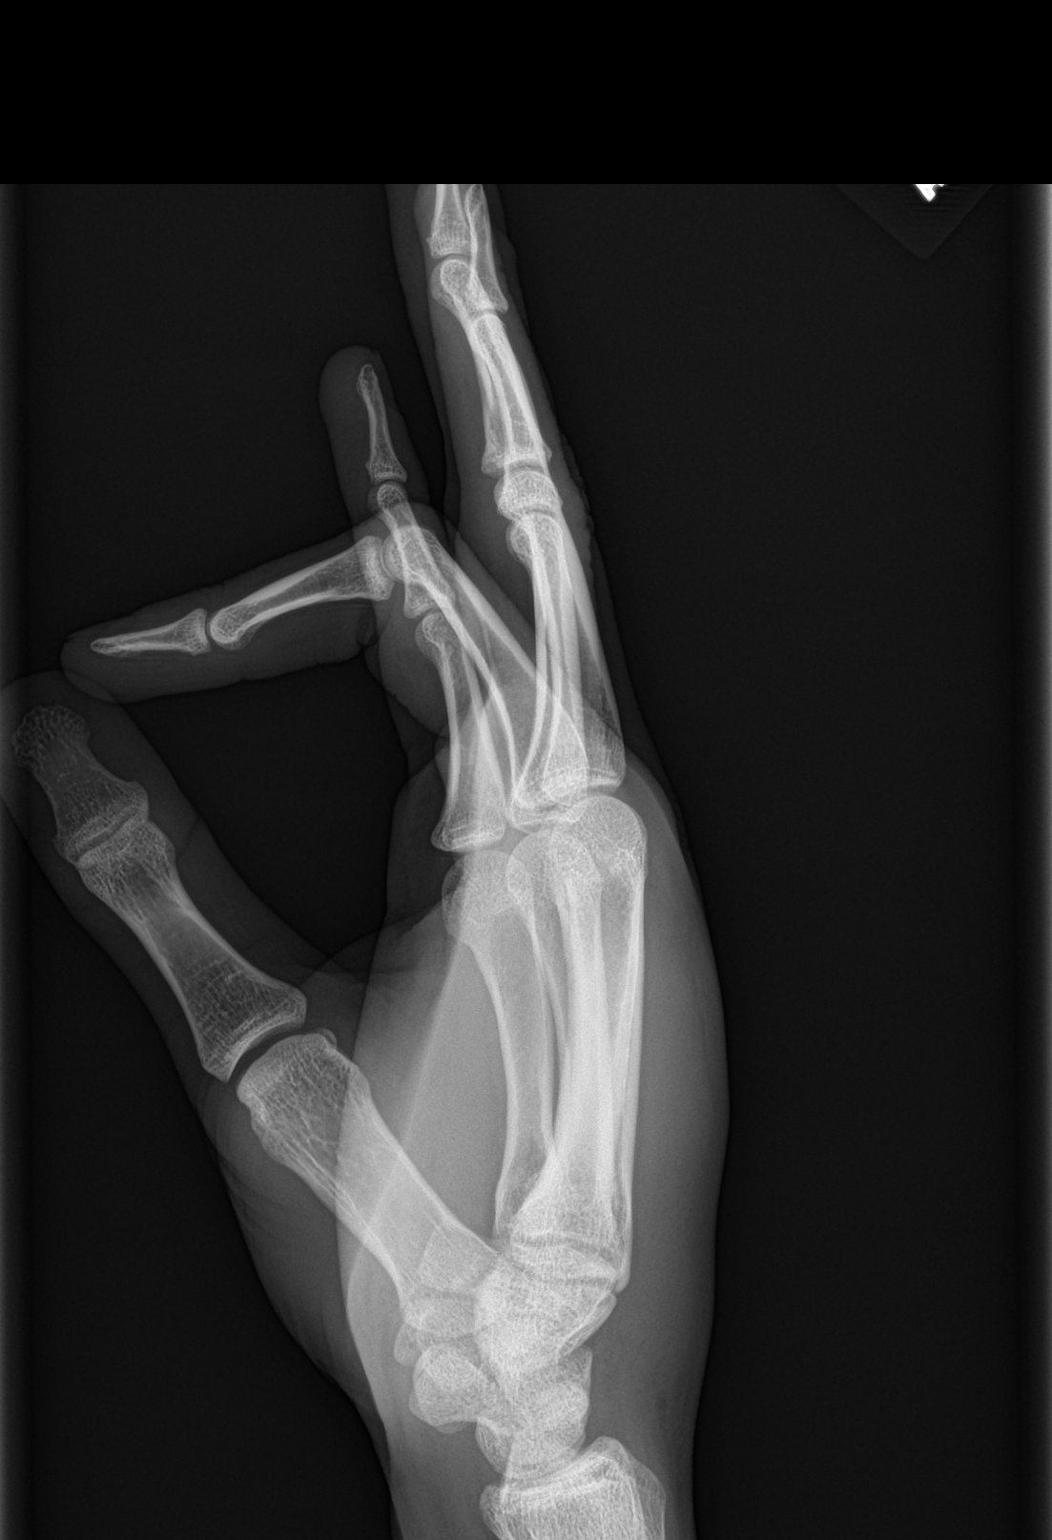

[3 of 3 positions shown; findings below may reference images not displayed]

FINDINGS: Three views of the right hand submitted. No acute fracture or
subluxation. Soft tissue swelling noted dorsal metacarpal region.
IMPRESSION: No acute fracture or subluxation. Soft tissue swelling metacarpal
region.

## 2017-01-23 IMAGING — CR DG CHEST 2V
2 series · 2 of 2 positions shown · non-contrast
Comparison: None.

CLINICAL DATA: Left-sided chest pain and palpitations. Tingling
going down left arm. Symptoms for 1 month, progressive today.

EXAM:
CHEST  2 VIEW

[chest pa]
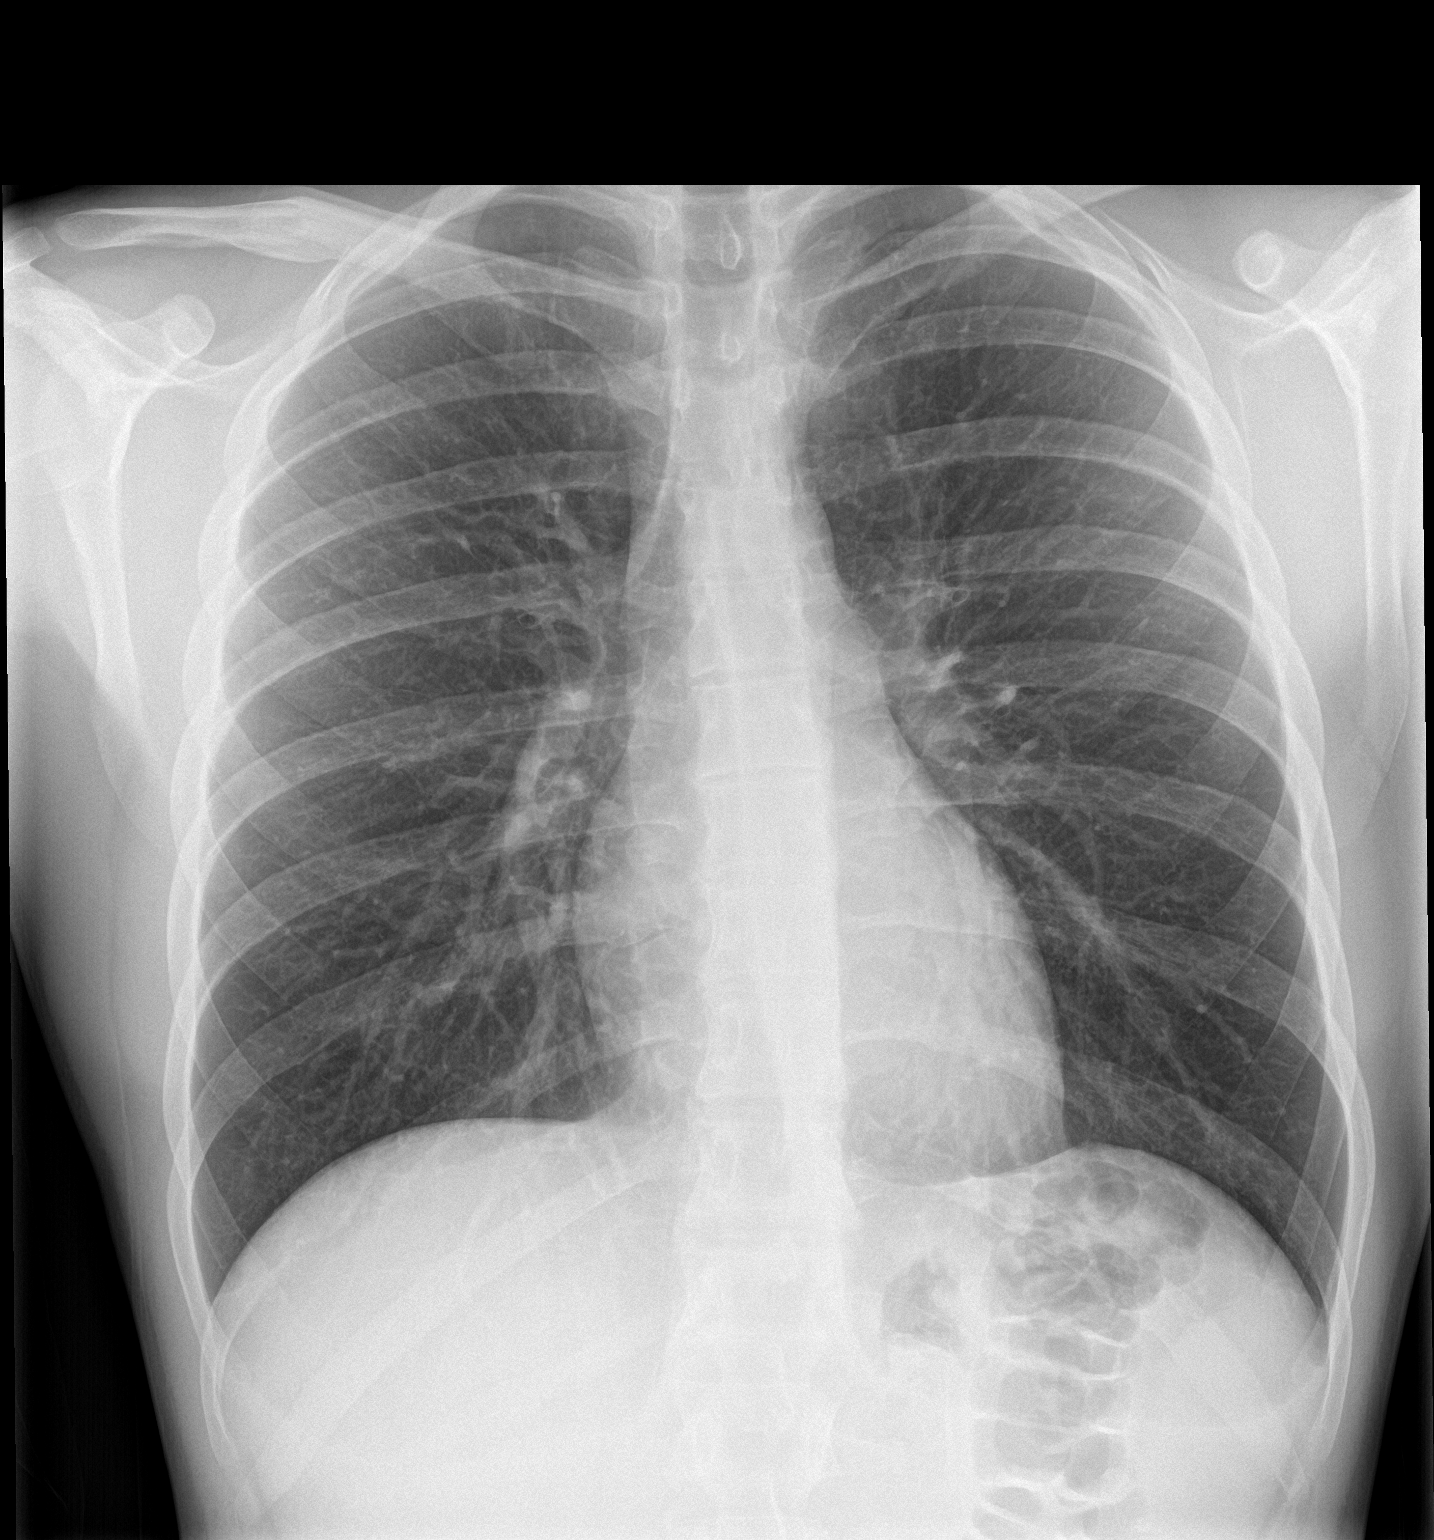

[chest lat]
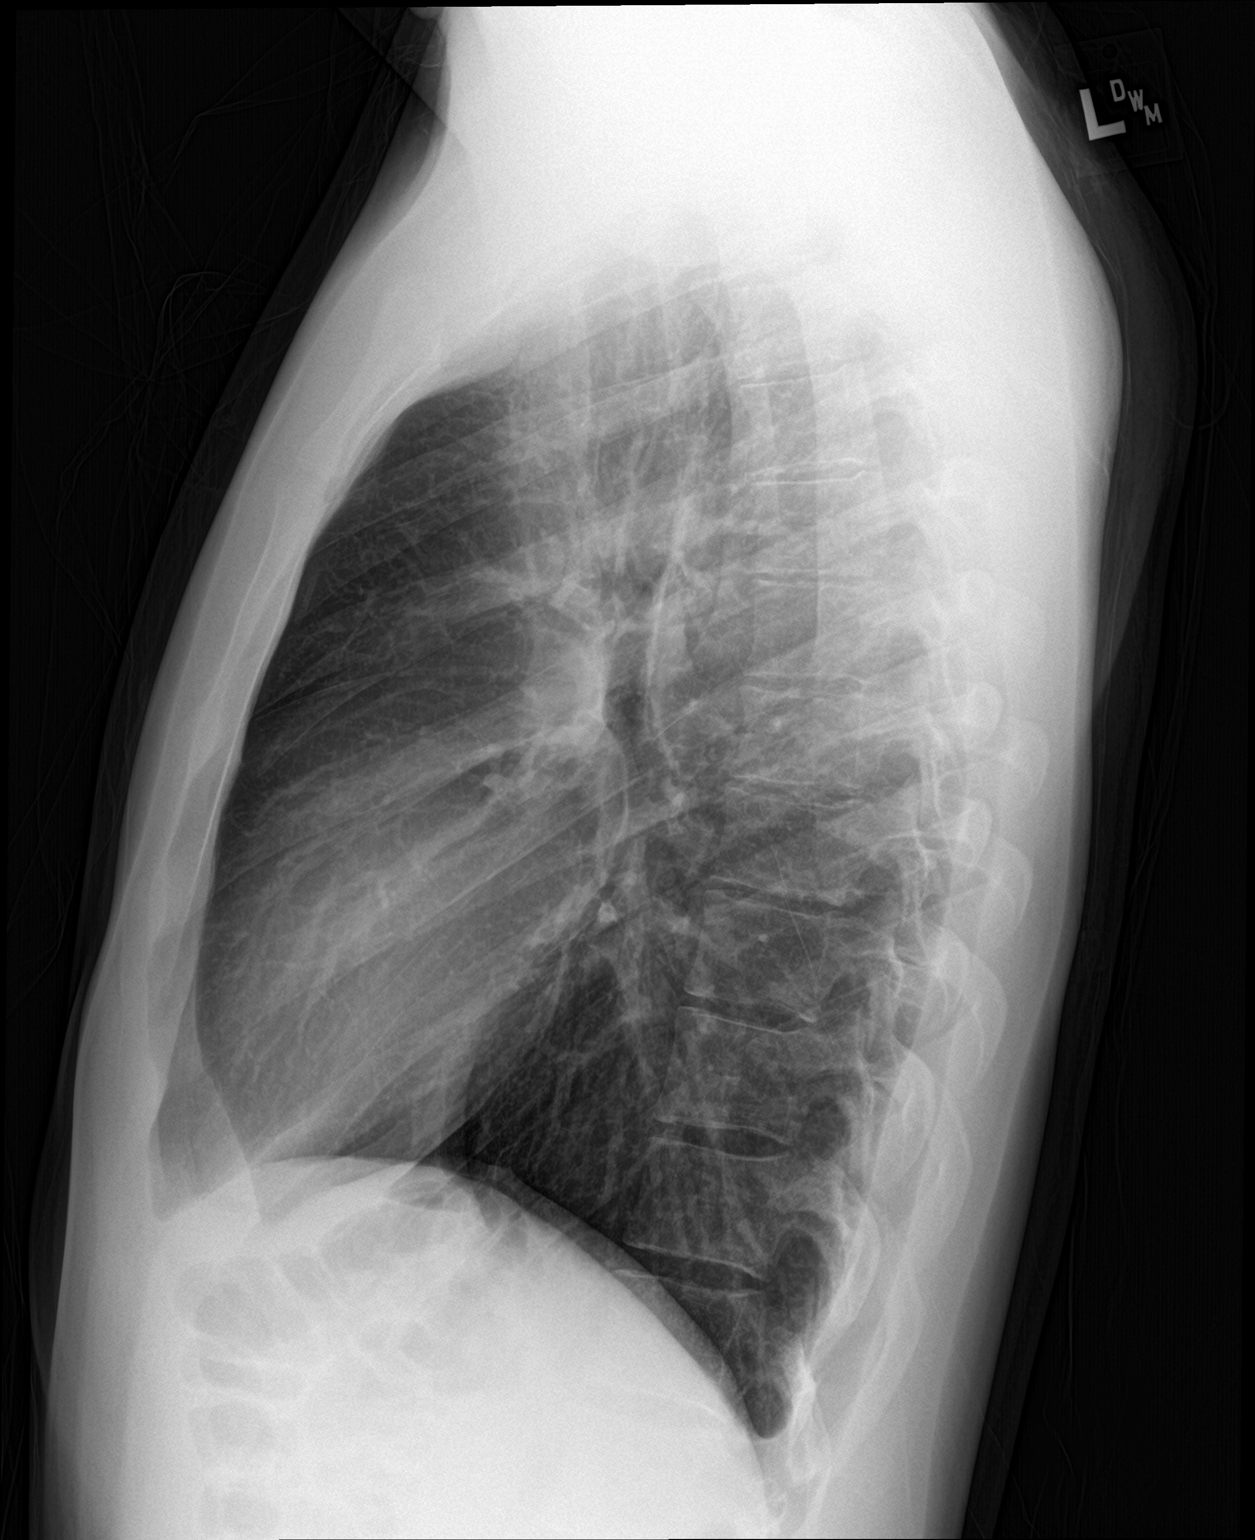

[2 of 2 positions shown; findings below may reference images not displayed]

FINDINGS: The cardiomediastinal contours are normal. The lungs are clear.
Pulmonary vasculature is normal. No consolidation, pleural effusion,
or pneumothorax. No acute osseous abnormalities are seen.
IMPRESSION: Normal chest radiograph.
